# Patient Record
Sex: Female | Born: 1995 | Race: Black or African American | Hispanic: No | Marital: Single | Smoking: Never smoker
Health system: Southern US, Community
[De-identification: ages and names within clinical notes are randomized; demographics above are authoritative.]

## PROBLEM LIST (undated history)

## (undated) DIAGNOSIS — T7840XA Allergy, unspecified, initial encounter: Secondary | ICD-10-CM

## (undated) DIAGNOSIS — D649 Anemia, unspecified: Secondary | ICD-10-CM

## (undated) DIAGNOSIS — J45909 Unspecified asthma, uncomplicated: Secondary | ICD-10-CM

## (undated) DIAGNOSIS — K589 Irritable bowel syndrome without diarrhea: Secondary | ICD-10-CM

## (undated) DIAGNOSIS — M1990 Unspecified osteoarthritis, unspecified site: Secondary | ICD-10-CM

## (undated) HISTORY — DX: Unspecified asthma, uncomplicated: J45.909

## (undated) HISTORY — PX: TONSILLECTOMY: SUR1361

## (undated) HISTORY — DX: Anemia, unspecified: D64.9

## (undated) HISTORY — PX: WISDOM TOOTH EXTRACTION: SHX21

## (undated) HISTORY — DX: Allergy, unspecified, initial encounter: T78.40XA

---

## 2013-04-02 ENCOUNTER — Encounter: Payer: Self-pay | Admitting: Obstetrics

## 2013-04-02 ENCOUNTER — Ambulatory Visit (INDEPENDENT_AMBULATORY_CARE_PROVIDER_SITE_OTHER): Payer: Medicaid Other | Admitting: Obstetrics

## 2013-04-02 VITALS — BP 144/75 | HR 77 | Temp 99.0°F | Ht 59.0 in | Wt 145.0 lb

## 2013-04-02 DIAGNOSIS — Z3041 Encounter for surveillance of contraceptive pills: Secondary | ICD-10-CM

## 2013-04-02 DIAGNOSIS — Z3202 Encounter for pregnancy test, result negative: Secondary | ICD-10-CM

## 2013-04-02 DIAGNOSIS — Z0000 Encounter for general adult medical examination without abnormal findings: Secondary | ICD-10-CM

## 2013-04-02 DIAGNOSIS — Z113 Encounter for screening for infections with a predominantly sexual mode of transmission: Secondary | ICD-10-CM

## 2013-04-02 DIAGNOSIS — Z0289 Encounter for other administrative examinations: Secondary | ICD-10-CM

## 2013-04-02 LAB — POCT URINALYSIS DIPSTICK
Bilirubin, UA: NEGATIVE
Clarity, UA: NEGATIVE
Glucose, UA: NEGATIVE
Spec Grav, UA: 1.02
Urobilinogen, UA: NEGATIVE

## 2013-04-02 LAB — POCT URINE PREGNANCY: Preg Test, Ur: NEGATIVE

## 2013-04-02 NOTE — Progress Notes (Signed)
Subjective:     Judy Paul is a 17 y.o. female here for a routine exam.  Current complaints: wants to know if she has fibroids, would like to change type of birth control she is taking.  Personal health questionnaire reviewed: yes.   Gynecologic History Patient's last menstrual period was 03/28/2013. Contraception: OCP (estrogen/progesterone) Last Pap: N/A Last mammogram: N/A  Obstetric History OB History  No data available     The following portions of the patient's history were reviewed and updated as appropriate: allergies, current medications, past family history, past medical history, past social history, past surgical history and problem list.  Review of Systems Pertinent items are noted in HPI.    Objective:    General appearance: alert and no distress Abdomen: normal findings: soft, non-tender Pelvic: cervix normal in appearance, external genitalia normal, no adnexal masses or tenderness, no cervical motion tenderness, rectovaginal septum normal, uterus normal size, shape, and consistency and vagina normal without discharge    Assessment:    Healthy female exam.    Plan:    Education reviewed: safe sex/STD prevention.    F/U 1 year.

## 2013-04-03 LAB — WET PREP BY MOLECULAR PROBE: Candida species: NEGATIVE

## 2013-04-08 ENCOUNTER — Other Ambulatory Visit: Payer: Self-pay | Admitting: *Deleted

## 2013-04-08 DIAGNOSIS — B9689 Other specified bacterial agents as the cause of diseases classified elsewhere: Secondary | ICD-10-CM

## 2013-04-08 MED ORDER — METRONIDAZOLE 500 MG PO TABS: 500.0000 mg | ORAL_TABLET | Freq: Two times a day (BID) | ORAL | Status: DC

## 2013-08-07 ENCOUNTER — Encounter: Payer: Self-pay | Admitting: Obstetrics

## 2014-04-02 ENCOUNTER — Encounter: Payer: Self-pay | Admitting: Obstetrics

## 2014-04-02 ENCOUNTER — Other Ambulatory Visit: Payer: Self-pay | Admitting: Obstetrics

## 2014-04-02 ENCOUNTER — Ambulatory Visit (INDEPENDENT_AMBULATORY_CARE_PROVIDER_SITE_OTHER): Payer: Medicaid Other | Admitting: Obstetrics

## 2014-04-02 VITALS — BP 120/75 | HR 84 | Temp 100.0°F | Ht 59.0 in | Wt 145.0 lb

## 2014-04-02 DIAGNOSIS — Z0289 Encounter for other administrative examinations: Secondary | ICD-10-CM

## 2014-04-02 DIAGNOSIS — Z0000 Encounter for general adult medical examination without abnormal findings: Secondary | ICD-10-CM

## 2014-04-02 DIAGNOSIS — Z3041 Encounter for surveillance of contraceptive pills: Secondary | ICD-10-CM

## 2014-04-02 MED ORDER — NORGESTIMATE-ETH ESTRADIOL 0.25-35 MG-MCG PO TABS: 1.0000 | ORAL_TABLET | Freq: Every day | ORAL | Status: AC

## 2014-04-02 NOTE — Progress Notes (Signed)
Subjective:     Judy Paul is a 18 y.o. female here for a routine exam.  Current complaints: none.    Personal health questionnaire:  Is patient Judy Paul, have a family history of breast and/or ovarian cancer: no Is there a family history of uterine cancer diagnosed at age < 3750, gastrointestinal cancer, urinary tract cancer, family member who is a Personnel officerLynch syndrome-associated carrier: no Is the patient overweight and hypertensive, family history of diabetes, personal history of gestational diabetes or PCOS: no Is patient over 255, have PCOS,  family history of premature CHD under age 20, diabetes, smoke, have hypertension or peripheral artery disease:  no At any time, has a partner hit, kicked or otherwise hurt or frightened you?: no Over the past 2 weeks, have you felt down, depressed or hopeless?: no Over the past 2 weeks, have you felt little interest or pleasure in doing things?:no   Gynecologic History Patient's last menstrual period was 03/28/2014. Contraception: OCP (estrogen/progesterone) Last Pap: none. Results were: n/a Last mammogram: n/a. Results were: n/a  Obstetric History OB History  No data available    Past Medical History  Diagnosis Date  . Allergy   . Anemia   . Asthma     Past Surgical History  Procedure Laterality Date  . Tonsillectomy Bilateral   . Wisdom tooth extraction Bilateral     Current outpatient prescriptions: norgestimate-ethinyl estradiol (ORTHO-CYCLEN,SPRINTEC,PREVIFEM) 0.25-35 MG-MCG tablet, Take 1 tablet by mouth daily., Disp: 1 Package, Rfl: 11 Allergies  Allergen Reactions  . Latex Rash  . Mucinex [Guaifenesin Er] Swelling and Rash    History  Substance Use Topics  . Smoking status: Never Smoker   . Smokeless tobacco: Never Used  . Alcohol Use: No    Family History  Problem Relation Age of Onset  . Fibroids Mother   . Eczema Father   . Infertility Maternal Aunt   . Infertility Maternal Uncle   . Cancer Maternal  Grandmother   . Hypertension Maternal Grandmother   . Bipolar disorder Maternal Grandmother   . Cancer Paternal Grandmother   . Hypertension Paternal Grandmother       Review of Systems  Constitutional: negative for fatigue and weight loss Respiratory: negative for cough and wheezing Cardiovascular: negative for chest pain, fatigue and palpitations Gastrointestinal: negative for abdominal pain and change in bowel habits Musculoskeletal:negative for myalgias Neurological: negative for gait problems and tremors Behavioral/Psych: negative for abusive relationship, depression Endocrine: negative for temperature intolerance   Genitourinary:negative for abnormal menstrual periods, genital lesions, hot flashes, sexual problems and vaginal discharge Integument/breast: negative for breast lump, breast tenderness, nipple discharge and skin lesion(s)    Objective:       BP 120/75 mmHg  Pulse 84  Temp(Src) 100 F (37.8 C)  Ht 4\' 11"  (1.499 m)  Wt 145 lb (65.772 kg)  BMI 29.27 kg/m2  LMP 03/28/2014 General:   alert  Skin:   no rash or abnormalities  Lungs:   clear to auscultation bilaterally  Heart:   regular rate and rhythm, S1, S2 normal, no murmur, click, rub or gallop  Breasts:   normal without suspicious masses, skin or nipple changes or axillary nodes  Abdomen:  normal findings: no organomegaly, soft, non-tender and no hernia  Pelvis:  External genitalia: normal general appearance Urinary system: urethral meatus normal and bladder without fullness, nontender Vaginal: normal without tenderness, induration or masses Cervix: normal appearance Adnexa: normal bimanual exam Uterus: anteverted and non-tender, normal size   Lab Review Urine pregnancy  test Labs reviewed yes Radiologic studies reviewed no    Assessment:    Healthy female exam.    Contraceptive Surveillance.  Pleased with OCP's.   Plan:    Education reviewed: low fat, low cholesterol diet, safe sex/STD  prevention and weight bearing exercise. Contraception: OCP (estrogen/progesterone). Follow up in: 1 year.   Meds ordered this encounter  Medications  . norgestimate-ethinyl estradiol (ORTHO-CYCLEN,SPRINTEC,PREVIFEM) 0.25-35 MG-MCG tablet    Sig: Take 1 tablet by mouth daily.    Dispense:  1 Package    Refill:  11   No orders of the defined types were placed in this encounter.

## 2014-04-03 ENCOUNTER — Other Ambulatory Visit: Payer: Self-pay | Admitting: Obstetrics

## 2014-04-03 DIAGNOSIS — N760 Acute vaginitis: Principal | ICD-10-CM

## 2014-04-03 DIAGNOSIS — B9689 Other specified bacterial agents as the cause of diseases classified elsewhere: Secondary | ICD-10-CM

## 2014-04-03 LAB — WET PREP BY MOLECULAR PROBE
Candida species: NEGATIVE
GARDNERELLA VAGINALIS: POSITIVE — AB
Trichomonas vaginosis: NEGATIVE

## 2014-04-03 LAB — GC/CHLAMYDIA PROBE AMP
CT PROBE, AMP APTIMA: NEGATIVE
GC Probe RNA: NEGATIVE

## 2014-04-03 MED ORDER — TINIDAZOLE 500 MG PO TABS: 1000.0000 mg | ORAL_TABLET | Freq: Every day | ORAL | Status: DC

## 2014-06-25 ENCOUNTER — Ambulatory Visit: Payer: Medicaid Other | Admitting: Obstetrics

## 2014-07-28 ENCOUNTER — Emergency Department (HOSPITAL_COMMUNITY)
Admission: EM | Admit: 2014-07-28 | Discharge: 2014-07-29 | Disposition: A | Discharge status: Home / Self Care | Payer: Self-pay | Attending: Emergency Medicine | Admitting: Emergency Medicine

## 2014-07-28 ENCOUNTER — Encounter (HOSPITAL_COMMUNITY): Payer: Self-pay | Admitting: Emergency Medicine

## 2014-07-28 DIAGNOSIS — Z91040 Latex allergy status: Secondary | ICD-10-CM | POA: Insufficient documentation

## 2014-07-28 DIAGNOSIS — J45909 Unspecified asthma, uncomplicated: Secondary | ICD-10-CM | POA: Insufficient documentation

## 2014-07-28 DIAGNOSIS — R109 Unspecified abdominal pain: Secondary | ICD-10-CM

## 2014-07-28 DIAGNOSIS — Z3202 Encounter for pregnancy test, result negative: Secondary | ICD-10-CM | POA: Insufficient documentation

## 2014-07-28 DIAGNOSIS — K529 Noninfective gastroenteritis and colitis, unspecified: Secondary | ICD-10-CM

## 2014-07-28 DIAGNOSIS — R110 Nausea: Secondary | ICD-10-CM

## 2014-07-28 DIAGNOSIS — R197 Diarrhea, unspecified: Secondary | ICD-10-CM

## 2014-07-28 DIAGNOSIS — B3731 Acute candidiasis of vulva and vagina: Secondary | ICD-10-CM

## 2014-07-28 DIAGNOSIS — Z862 Personal history of diseases of the blood and blood-forming organs and certain disorders involving the immune mechanism: Secondary | ICD-10-CM | POA: Insufficient documentation

## 2014-07-28 DIAGNOSIS — B9689 Other specified bacterial agents as the cause of diseases classified elsewhere: Secondary | ICD-10-CM

## 2014-07-28 DIAGNOSIS — B373 Candidiasis of vulva and vagina: Secondary | ICD-10-CM | POA: Insufficient documentation

## 2014-07-28 DIAGNOSIS — N760 Acute vaginitis: Secondary | ICD-10-CM | POA: Insufficient documentation

## 2014-07-28 DIAGNOSIS — Z793 Long term (current) use of hormonal contraceptives: Secondary | ICD-10-CM | POA: Insufficient documentation

## 2014-07-28 DIAGNOSIS — R1013 Epigastric pain: Secondary | ICD-10-CM

## 2014-07-28 LAB — CBC WITH DIFFERENTIAL/PLATELET
BASOS ABS: 0.1 10*3/uL (ref 0.0–0.1)
Basophils Relative: 1 % (ref 0–1)
EOS PCT: 1 % (ref 0–5)
Eosinophils Absolute: 0 10*3/uL (ref 0.0–0.7)
HCT: 34.4 % — ABNORMAL LOW (ref 36.0–46.0)
Hemoglobin: 11.3 g/dL — ABNORMAL LOW (ref 12.0–15.0)
Lymphocytes Relative: 47 % — ABNORMAL HIGH (ref 12–46)
Lymphs Abs: 2.1 10*3/uL (ref 0.7–4.0)
MCH: 25.9 pg — AB (ref 26.0–34.0)
MCHC: 32.8 g/dL (ref 30.0–36.0)
MCV: 78.7 fL (ref 78.0–100.0)
MONO ABS: 0.3 10*3/uL (ref 0.1–1.0)
Monocytes Relative: 8 % (ref 3–12)
Neutro Abs: 1.9 10*3/uL (ref 1.7–7.7)
Neutrophils Relative %: 43 % (ref 43–77)
PLATELETS: 245 10*3/uL (ref 150–400)
RBC: 4.37 MIL/uL (ref 3.87–5.11)
RDW: 12.7 % (ref 11.5–15.5)
WBC: 4.4 10*3/uL (ref 4.0–10.5)

## 2014-07-28 LAB — URINALYSIS, ROUTINE W REFLEX MICROSCOPIC
BILIRUBIN URINE: NEGATIVE
Glucose, UA: NEGATIVE mg/dL
HGB URINE DIPSTICK: NEGATIVE
Ketones, ur: NEGATIVE mg/dL
Nitrite: NEGATIVE
PROTEIN: NEGATIVE mg/dL
Specific Gravity, Urine: 1.016 (ref 1.005–1.030)
Urobilinogen, UA: 1 mg/dL (ref 0.0–1.0)
pH: 8 (ref 5.0–8.0)

## 2014-07-28 LAB — COMPREHENSIVE METABOLIC PANEL
ALK PHOS: 22 U/L — AB (ref 39–117)
ALT: 19 U/L (ref 0–35)
ANION GAP: 8 (ref 5–15)
AST: 24 U/L (ref 0–37)
Albumin: 3.5 g/dL (ref 3.5–5.2)
BILIRUBIN TOTAL: 0.3 mg/dL (ref 0.3–1.2)
BUN: 6 mg/dL (ref 6–23)
CHLORIDE: 108 mmol/L (ref 96–112)
CO2: 22 mmol/L (ref 19–32)
CREATININE: 0.74 mg/dL (ref 0.50–1.10)
Calcium: 9 mg/dL (ref 8.4–10.5)
GFR calc Af Amer: >90 mL/min (ref >90)
GFR calc non Af Amer: >90 mL/min (ref >90)
Glucose, Bld: 96 mg/dL (ref 70–99)
Potassium: 3.9 mmol/L (ref 3.5–5.1)
Sodium: 138 mmol/L (ref 135–145)
Total Protein: 6.1 g/dL (ref 6.0–8.3)

## 2014-07-28 LAB — URINE MICROSCOPIC-ADD ON

## 2014-07-28 LAB — LIPASE, BLOOD: Lipase: 37 U/L (ref 11–59)

## 2014-07-28 LAB — PREGNANCY, URINE: PREG TEST UR: NEGATIVE

## 2014-07-28 MED ORDER — DICYCLOMINE HCL 10 MG/ML IM SOLN
20.0000 mg | Freq: Once | INTRAMUSCULAR | Status: AC
  Administered 2014-07-28: 20 mg via INTRAMUSCULAR
  Filled 2014-07-28: qty 2

## 2014-07-28 MED ORDER — SODIUM CHLORIDE 0.9 % IV SOLN
INTRAVENOUS | Status: DC
  Administered 2014-07-28: 23:00:00 via INTRAVENOUS

## 2014-07-28 MED ORDER — IOHEXOL 300 MG/ML  SOLN
25.0000 mL | Freq: Once | INTRAMUSCULAR | Status: AC | PRN
  Administered 2014-07-28: 25 mL via ORAL

## 2014-07-28 MED ORDER — ONDANSETRON HCL 4 MG/2ML IJ SOLN: 4.0000 mg | INTRAMUSCULAR | Status: DC | PRN

## 2014-07-28 MED ORDER — ONDANSETRON 4 MG PO TBDP
8.0000 mg | ORAL_TABLET | Freq: Once | ORAL | Status: AC
  Administered 2014-07-28: 8 mg via ORAL
  Filled 2014-07-28: qty 2

## 2014-07-28 NOTE — ED Notes (Signed)
Pt states also has had yellowish vaginal discharge

## 2014-07-28 NOTE — ED Notes (Signed)
Pt from home, states has had nausea started last week, cramping started 2 days ago, diarrhea started 2 days ago-- [place] in [place], returned Friday. Started feeling sick while in [place]. States lost 10 pounds in past few days. Unable to eat anything.

## 2014-07-29 ENCOUNTER — Encounter (HOSPITAL_COMMUNITY): Payer: Self-pay

## 2014-07-29 ENCOUNTER — Emergency Department (HOSPITAL_COMMUNITY): Payer: Self-pay

## 2014-07-29 LAB — GC/CHLAMYDIA PROBE AMP (~~LOC~~) NOT AT ARMC
Chlamydia: NEGATIVE
Neisseria Gonorrhea: NEGATIVE

## 2014-07-29 LAB — WET PREP, GENITAL: TRICH WET PREP: NONE SEEN

## 2014-07-29 MED ORDER — CIPROFLOXACIN HCL 500 MG PO TABS: 500.0000 mg | ORAL_TABLET | Freq: Two times a day (BID) | ORAL | Status: DC

## 2014-07-29 MED ORDER — METRONIDAZOLE 500 MG PO TABS: 500.0000 mg | ORAL_TABLET | Freq: Three times a day (TID) | ORAL | Status: DC

## 2014-07-29 MED ORDER — PROMETHAZINE HCL 25 MG PO TABS: 25.0000 mg | ORAL_TABLET | Freq: Four times a day (QID) | ORAL | Status: DC | PRN

## 2014-07-29 MED ORDER — CIPROFLOXACIN HCL 500 MG PO TABS
500.0000 mg | ORAL_TABLET | Freq: Once | ORAL | Status: AC
  Administered 2014-07-29: 500 mg via ORAL
  Filled 2014-07-29: qty 1

## 2014-07-29 MED ORDER — METRONIDAZOLE 500 MG PO TABS
500.0000 mg | ORAL_TABLET | Freq: Once | ORAL | Status: AC
  Administered 2014-07-29: 500 mg via ORAL
  Filled 2014-07-29: qty 1

## 2014-07-29 MED ORDER — FLUCONAZOLE 100 MG PO TABS
150.0000 mg | ORAL_TABLET | Freq: Once | ORAL | Status: AC
  Administered 2014-07-29: 150 mg via ORAL
  Filled 2014-07-29: qty 2

## 2014-07-29 MED ORDER — IOHEXOL 300 MG/ML  SOLN
100.0000 mL | Freq: Once | INTRAMUSCULAR | Status: AC | PRN
  Administered 2014-07-29: 100 mL via INTRAVENOUS

## 2014-07-29 NOTE — ED Provider Notes (Signed)
CSN: [id number]     Arrival date & time 07/28/14  7846 History   First MD Initiated Contact with Patient 07/28/14 2220     Chief Complaint  Patient presents with  . Nausea  . Abdominal Cramping  . Diarrhea    HPI  Pt was seen at 2230. Per pt, c/o gradual onset and persistence of multiple intermittent episodes of diarrhea that began 2 days ago.   Describes the stools as initially "loose" then "watery." Has been associated with nausea and generalized "cramping" abd pain. Denies vomiting, no CP/SOB, no back pain, no fevers, no black or blood in stools. Pt states she was "starting to have symptoms" while in [place] (nausea), returned to the [place] on Friday (4 days ago). Pt states she was living with a local family there; water was "chlorined" and no family members were ill or had similar symptoms. Pt also c/o "yellow" vaginal discharge" for the past week. Denies pelvic pain, no dysuria, no vaginal bleeding.    Past Medical History  Diagnosis Date  . Allergy   . Anemia   . Asthma    Past Surgical History  Procedure Laterality Date  . Tonsillectomy Bilateral   . Wisdom tooth extraction Bilateral    Family History  Problem Relation Age of Onset  . Fibroids Mother   . Eczema Father   . Infertility Maternal Aunt   . Infertility Maternal Uncle   . Cancer Maternal Grandmother   . Hypertension Maternal Grandmother   . Bipolar disorder Maternal Grandmother   . Cancer Paternal Grandmother   . Hypertension Paternal Grandmother    History  Substance Use Topics  . Smoking status: Never Smoker   . Smokeless tobacco: Never Used  . Alcohol Use: No    Review of Systems ROS: Statement: All systems negative except as marked or noted in the HPI; Constitutional: Negative for fever and chills. ; ; Eyes: Negative for eye pain, redness and discharge. ; ; ENMT: Negative for ear pain, hoarseness, nasal congestion, sinus pressure and sore throat. ; ; Cardiovascular: Negative for chest pain, palpitations,  diaphoresis, dyspnea and peripheral edema. ; ; Respiratory: Negative for cough, wheezing and stridor. ; ; Gastrointestinal: +nausea, abd pain. Negative for vomiting, blood in stool, hematemesis, jaundice and rectal bleeding. . ; ; Genitourinary: Negative for dysuria, flank pain and hematuria. ; ; GYN:  No vaginal bleeding, +vaginal discharge, no vulvar pain. ;; Musculoskeletal: Negative for back pain and neck pain. Negative for swelling and trauma.; ; Skin: Negative for pruritus, rash, abrasions, blisters, bruising and skin lesion.; ; Neuro: Negative for headache, lightheadedness and neck stiffness. Negative for weakness, altered level of consciousness , altered mental status, extremity weakness, paresthesias, involuntary movement, seizure and syncope.      Allergies  Latex and Mucinex  Home Medications   Prior to Admission medications   Medication Sig Start Date End Date Taking? Authorizing Provider  norgestimate-ethinyl estradiol (ORTHO-CYCLEN,SPRINTEC,PREVIFEM) 0.25-35 MG-MCG tablet Take 1 tablet by mouth daily. 04/02/14  Yes Brock Bad, MD  tinidazole (TINDAMAX) 500 MG tablet Take 2 tablets (1,000 mg total) by mouth daily with breakfast. Patient not taking: Reported on 07/28/2014 04/03/14   Brock Bad, MD   BP 113/74 mmHg  Pulse 57  Temp(Src) 98.9 F (37.2 C) (Oral)  Resp 18  Ht  (1.6 m)  Wt 145 lb 7 oz (65.97 kg)  BMI 25.77 kg/m2  SpO2 100%  LMP 05/22/2014 Physical Exam  2235: Physical examination:  Nursing notes reviewed; Vital signs  and O2 SAT reviewed;  Constitutional: Well developed, Well nourished, Well hydrated, In no acute distress; Head:  Normocephalic, atraumatic; Eyes: EOMI, PERRL, No scleral icterus; ENMT: Mouth and pharynx normal, Mucous membranes moist; Neck: Supple, Full range of motion, No lymphadenopathy; Cardiovascular: Regular rate and rhythm, No murmur, rub, or gallop; Respiratory: Breath sounds clear & equal bilaterally, No rales, rhonchi, wheezes.   Speaking full sentences with ease, Normal respiratory effort/excursion; Chest: Nontender, Movement normal; Abdomen: Soft, +mild diffuse tenderness to palp. No rebound or guarding. Nondistended, Normal bowel sounds; Genitourinary: No CVA tenderness. Pelvic exam performed with permission of pt and female ED tech assist during exam.  External genitalia w/o lesions. Vaginal vault with thick white discharge.  Cervix w/o lesions, not friable, GC/chlam and wet prep obtained and sent to lab.  Bimanual exam w/o CMT, uterine or adnexal tenderness.; Extremities: Pulses normal, No tenderness, No edema, No calf edema or asymmetry.; Neuro: AA&Ox3, Major CN grossly intact.  Speech clear. No gross focal motor or sensory deficits in extremities. Climbs on and off stretcher easily by herself. Gait steady.; Skin: Color normal, Warm, Dry.   ED Course  Procedures     EKG Interpretation None      MDM  MDM Reviewed: previous chart, nursing note and vitals Reviewed previous: labs Interpretation: labs and CT scan     Results for orders placed or performed during the hospital encounter of 07/28/14  Wet prep, genital  Result Value Ref Range   Yeast Wet Prep HPF POC FEW (A) NONE SEEN   Trich, Wet Prep NONE SEEN NONE SEEN   Clue Cells Wet Prep HPF POC FEW (A) NONE SEEN   WBC, Wet Prep HPF POC MANY (A) NONE SEEN  CBC with Differential  Result Value Ref Range   WBC 4.4 4.0 - 10.5 K/uL   RBC 4.37 3.87 - 5.11 MIL/uL   Hemoglobin 11.3 (L) 12.0 - 15.0 g/dL   HCT 14.734.4 (L) 82.936.0 - 56.246.0 %   MCV 78.7 78.0 - 100.0 fL   MCH 25.9 (L) 26.0 - 34.0 pg   MCHC 32.8 30.0 - 36.0 g/dL   RDW 13.012.7 86.511.5 - 78.415.5 %   Platelets 245 150 - 400 K/uL   Neutrophils Relative % 43 43 - 77 %   Neutro Abs 1.9 1.7 - 7.7 K/uL   Lymphocytes Relative 47 (H) 12 - 46 %   Lymphs Abs 2.1 0.7 - 4.0 K/uL   Monocytes Relative 8 3 - 12 %   Monocytes Absolute 0.3 0.1 - 1.0 K/uL   Eosinophils Relative 1 0 - 5 %   Eosinophils Absolute 0.0 0.0 - 0.7  K/uL   Basophils Relative 1 0 - 1 %   Basophils Absolute 0.1 0.0 - 0.1 K/uL  Comprehensive metabolic panel  Result Value Ref Range   Sodium 138 135 - 145 mmol/L   Potassium 3.9 3.5 - 5.1 mmol/L   Chloride 108 96 - 112 mmol/L   CO2 22 19 - 32 mmol/L   Glucose, Bld 96 70 - 99 mg/dL   BUN 6 6 - 23 mg/dL   Creatinine, Ser 6.960.74 0.50 - 1.10 mg/dL   Calcium 9.0 8.4 - 29.510.5 mg/dL   Total Protein 6.1 6.0 - 8.3 g/dL   Albumin 3.5 3.5 - 5.2 g/dL   AST 24 0 - 37 U/L   ALT 19 0 - 35 U/L   Alkaline Phosphatase 22 (L) 39 - 117 U/L   Total Bilirubin 0.3 0.3 - 1.2 mg/dL  GFR calc non Af Amer >90 >90 mL/min   GFR calc Af Amer >90 >90 mL/min   Anion gap 8 5 - 15  Lipase, blood  Result Value Ref Range   Lipase 37 11 - 59 U/L  Urinalysis, Routine w reflex microscopic  Result Value Ref Range   Color, Urine YELLOW YELLOW   APPearance CLEAR CLEAR   Specific Gravity, Urine 1.016 1.005 - 1.030   pH 8.0 5.0 - 8.0   Glucose, UA NEGATIVE NEGATIVE mg/dL   Hgb urine dipstick NEGATIVE NEGATIVE   Bilirubin Urine NEGATIVE NEGATIVE   Ketones, ur NEGATIVE NEGATIVE mg/dL   Protein, ur NEGATIVE NEGATIVE mg/dL   Urobilinogen, UA 1.0 0.0 - 1.0 mg/dL   Nitrite NEGATIVE NEGATIVE   Leukocytes, UA SMALL (A) NEGATIVE  Urine microscopic-add on  Result Value Ref Range   Squamous Epithelial / LPF RARE RARE   WBC, UA 0-2 <3 WBC/hpf   Bacteria, UA RARE RARE  Pregnancy, urine  Result Value Ref Range   Preg Test, Ur NEGATIVE NEGATIVE    2300:   Labs and UA/preg resulted. Pt unable to stool while in the ED.  No N/V while in the ED. Abd cramping "better" after meds. CT A/P pending. T/C to ID Dr. Ilsa Iha, case discussed, including:  HPI, pertinent PM/SHx, VS/PE, dx testing, ED course and treatment:  Agrees with ED workup, obtain stool GI pathogen panel, rx cipro  PO BID #6 tabs.  Dx and testing, as well as d/w ID MD, d/w pt.  Questions answered.  Verb understanding, agreeable with plan.   0120:  Pt has not stooled  while in the ED. Pt has tol PO well without N/V. CT A/P pending. Pt will need to be tx for BV and candidal vaginitis on d/c. Sign out to Dr. Norlene Campbell.   Samuel Jester, DO 07/29/14 0126

## 2014-07-29 NOTE — ED Notes (Signed)
Patient transported to CT 

## 2014-07-29 NOTE — Discharge Instructions (Signed)
Your CAT scan today showed signs of colitis, which is infection or inflammation of your colon.  Take antibiotics as prescribed.  Return to the emergency department for worsening pain, vomiting despite medication, persistent fevers or new concerning symptoms.  Follow-up with your primary care doctor or with the health and wellness Center listed above for recheck in 3-5 days.  Make sure that you complete all of your antibiotics.    Abdominal Pain, Women Abdominal (stomach, pelvic, or belly) pain can be caused by many things. It is important to tell your doctor:  The location of the pain.  Does it come and go or is it present all the time?  Are there things that start the pain (eating certain foods, exercise)?  Are there other symptoms associated with the pain (fever, nausea, vomiting, diarrhea)? All of this is helpful to know when trying to find the cause of the pain. CAUSES   Stomach: virus or bacteria infection, or ulcer.  Intestine: appendicitis (inflamed appendix), regional ileitis (Crohn's disease), ulcerative colitis (inflamed colon), irritable bowel syndrome, diverticulitis (inflamed diverticulum of the colon), or cancer of the stomach or intestine.  Gallbladder disease or stones in the gallbladder.  Kidney disease, kidney stones, or infection.  Pancreas infection or cancer.  Fibromyalgia (pain disorder).  Diseases of the female organs:  Uterus: fibroid (non-cancerous) tumors or infection.  Fallopian tubes: infection or tubal pregnancy.  Ovary: cysts or tumors.  Pelvic adhesions (scar tissue).  Endometriosis (uterus lining tissue growing in the pelvis and on the pelvic organs).  Pelvic congestion syndrome (female organs filling up with blood just before the menstrual period).  Pain with the menstrual period.  Pain with ovulation (producing an egg).  Pain with an IUD (intrauterine device, birth control) in the uterus.  Cancer of the female organs.  Functional  pain (pain not caused by a disease, may improve without treatment).  Psychological pain.  Depression. DIAGNOSIS  Your doctor will decide the seriousness of your pain by doing an examination.  Blood tests.  X-rays.  Ultrasound.  CT scan (computed tomography, special type of X-ray).  MRI (magnetic resonance imaging).  Cultures, for infection.  Barium enema (dye inserted in the large intestine, to better view it with X-rays).  Colonoscopy (looking in intestine with a lighted tube).  Laparoscopy (minor surgery, looking in abdomen with a lighted tube).  Major abdominal exploratory surgery (looking in abdomen with a large incision). TREATMENT  The treatment will depend on the cause of the pain.   Many cases can be observed and treated at home.  Over-the-counter medicines recommended by your caregiver.  Prescription medicine.  Antibiotics, for infection.  Birth control pills, for painful periods or for ovulation pain.  Hormone treatment, for endometriosis.  Nerve blocking injections.  Physical therapy.  Antidepressants.  Counseling with a psychologist or psychiatrist.  Minor or major surgery. HOME CARE INSTRUCTIONS   Do not take laxatives, unless directed by your caregiver.  Take over-the-counter pain medicine only if ordered by your caregiver. Do not take aspirin because it can cause an upset stomach or bleeding.  Try a clear liquid diet (broth or water) as ordered by your caregiver. Slowly move to a bland diet, as tolerated, if the pain is related to the stomach or intestine.  Have a thermometer and take your temperature several times a day, and record it.  Bed rest and sleep, if it helps the pain.  Avoid sexual intercourse, if it causes pain.  Avoid stressful situations.  Keep your follow-up appointments  and tests, as your caregiver orders.  If the pain does not go away with medicine or surgery, you may try:  Acupuncture.  Relaxation exercises  (yoga, meditation).  Group therapy.  Counseling. SEEK MEDICAL CARE IF:   You notice certain foods cause stomach pain.  Your home care treatment is not helping your pain.  You need stronger pain medicine.  You want your IUD removed.  You feel faint or lightheaded.  You develop nausea and vomiting.  You develop a rash.  You are having side effects or an allergy to your medicine. SEEK IMMEDIATE MEDICAL CARE IF:   Your pain does not go away or gets worse.  You have a fever.  Your pain is felt only in portions of the abdomen. The right side could possibly be appendicitis. The left lower portion of the abdomen could be colitis or diverticulitis.  You are passing blood in your stools (bright red or black tarry stools, with or without vomiting).  You have blood in your urine.  You develop chills, with or without a fever.  You pass out. MAKE SURE YOU:   Understand these instructions.  Will watch your condition.  Will get help right away if you are not doing well or get worse. Document Released: 02/26/2007 Document Revised: 09/15/2013 Document Reviewed: 03/18/2009 [facility] Patient Information 2015 [place], [place]. This information is not intended to replace advice given to you by your health care provider. Make sure you discuss any questions you have with your health care provider.  Candidal Vulvovaginitis Candidal vulvovaginitis is an infection of the vagina and vulva. The vulva is the skin around the opening of the vagina. This may cause itching and discomfort in and around the vagina.  HOME CARE  Only take medicine as told by your doctor.  Do not have sex (intercourse) until the infection is healed or as told by your doctor.  Practice safe sex.  Tell your sex partner about your infection.  Do not douche or use tampons.  Wear cotton underwear. Do not wear tight pants or panty hose.  Eat yogurt. This may help treat and prevent yeast infections. GET HELP RIGHT  AWAY IF:   You have a fever.  Your problems get worse during treatment or do not get better in 3 days.  You have discomfort, irritation, or itching in your vagina or vulva area.  You have pain after sex.  You start to get belly (abdominal) pain. MAKE SURE YOU:  Understand these instructions.  Will watch your condition.  Will get help right away if you are not doing well or get worse. Document Released: 07/28/2008 Document Revised: 05/06/2013 Document Reviewed: 07/28/2008 [facility] Patient Information 2015 [place], [place]. This information is not intended to replace advice given to you by your health care provider. Make sure you discuss any questions you have with your health care provider.  Colitis Colitis is inflammation of the colon. Colitis can be a short-term or long-standing (chronic) illness. Crohn's disease and ulcerative colitis are 2 types of colitis which are chronic. They usually require lifelong treatment. CAUSES  There are many different causes of colitis, including:  Viruses.  Germs (bacteria).  Medicine reactions. SYMPTOMS   Diarrhea.  Intestinal bleeding.  Pain.  Fever.  Throwing up (vomiting).  Tiredness (fatigue).  Weight loss.  Bowel blockage. DIAGNOSIS  The diagnosis of colitis is based on examination and stool or blood tests. X-rays, CT scan, and colonoscopy may also be needed. TREATMENT  Treatment may include:  Fluids given through  the vein (intravenously).  Bowel rest (nothing to eat or drink for a period of time).  Medicine for pain and diarrhea.  Medicines (antibiotics) that kill germs.  Cortisone medicines.  Surgery. HOME CARE INSTRUCTIONS   Get plenty of rest.  Drink enough water and fluids to keep your urine clear or pale yellow.  Eat a well-balanced diet.  Call your caregiver for follow-up as recommended. SEEK IMMEDIATE MEDICAL CARE IF:   You develop chills.  You have an oral temperature above 102 F (38.9 C),  not controlled by medicine.  You have extreme weakness, fainting, or dehydration.  You have repeated vomiting.  You develop severe belly (abdominal) pain or are passing bloody or tarry stools. MAKE SURE YOU:   Understand these instructions.  Will watch your condition.  Will get help right away if you are not doing well or get worse. Document Released: 06/08/2004 Document Revised: 07/24/2011 Document Reviewed: 09/03/2009 [facility] Patient Information 2015 [place]. This information is not intended to replace advice given to you by your health care provider. Make sure you discuss any questions you have with your health care provider.    Vaginitis Vaginitis is an inflammation of the vagina. It can happen when the normal bacteria and yeast in the vagina grow too much. There are different types. Treatment will depend on the type you have. HOME CARE  Take all medicines as told by your doctor.  Keep your vagina area clean and dry. Avoid soap. Rinse the area with water.  Avoid washing and cleaning out the vagina (douching).  Do not use tampons or have sex (intercourse) until your treatment is done.  Wipe from front to back after going to the restroom.  Wear cotton underwear.  Avoid wearing underwear while you sleep until your vaginitis is gone.  Avoid tight pants. Avoid underwear or nylons without a cotton panel.  Take off wet clothing (such as a bathing suit) as soon as you can.  Use mild, unscented products. Avoid fabric softeners and scented:  Feminine sprays.  Laundry detergents.  Tampons.  Soaps or bubble baths.  Practice safe sex and use condoms. GET HELP RIGHT AWAY IF:   You have belly (abdominal) pain.  You have a fever or lasting symptoms for more than 2-3 days.  You have a fever and your symptoms suddenly get worse. MAKE SURE YOU:   Understand these instructions.  Will watch this condition.  Will get help right away if you are not doing well or  get worse. Document Released: 07/28/2008 Document Revised: 01/24/2012 Document Reviewed: 10/12/2011 [facility] Patient Information 2015 [place], [place]. This information is not intended to replace advice given to you by your health care provider. Make sure you discuss any questions you have with your health care provider.

## 2014-07-29 NOTE — ED Provider Notes (Signed)
Care assumed from Dr. Charleston Poot at change of shift.  Patient with 2 days of diarrhea, abdominal cramping pain.  Patient recently in [place].  Plan is to treat for bv, vaginal yeast, and cipro for travellers diarrhea barring any findings on CT scan.  CT with diffuse colitis, will treat with cipro/flagyl for 10 days  Results for orders placed or performed during the hospital encounter of 07/28/14  Wet prep, genital  Result Value Ref Range   Yeast Wet Prep HPF POC FEW (A) NONE SEEN   Trich, Wet Prep NONE SEEN NONE SEEN   Clue Cells Wet Prep HPF POC FEW (A) NONE SEEN   WBC, Wet Prep HPF POC MANY (A) NONE SEEN  CBC with Differential  Result Value Ref Range   WBC 4.4 4.0 - 10.5 K/uL   RBC 4.37 3.87 - 5.11 MIL/uL   Hemoglobin 11.3 (L) 12.0 - 15.0 g/dL   HCT 16.1 (L) 09.6 - 04.5 %   MCV 78.7 78.0 - 100.0 fL   MCH 25.9 (L) 26.0 - 34.0 pg   MCHC 32.8 30.0 - 36.0 g/dL   RDW 40.9 81.1 - 91.4 %   Platelets 245 150 - 400 K/uL   Neutrophils Relative % 43 43 - 77 %   Neutro Abs 1.9 1.7 - 7.7 K/uL   Lymphocytes Relative 47 (H) 12 - 46 %   Lymphs Abs 2.1 0.7 - 4.0 K/uL   Monocytes Relative 8 3 - 12 %   Monocytes Absolute 0.3 0.1 - 1.0 K/uL   Eosinophils Relative 1 0 - 5 %   Eosinophils Absolute 0.0 0.0 - 0.7 K/uL   Basophils Relative 1 0 - 1 %   Basophils Absolute 0.1 0.0 - 0.1 K/uL  Comprehensive metabolic panel  Result Value Ref Range   Sodium 138 135 - 145 mmol/L   Potassium 3.9 3.5 - 5.1 mmol/L   Chloride 108 96 - 112 mmol/L   CO2 22 19 - 32 mmol/L   Glucose, Bld 96 70 - 99 mg/dL   BUN 6 6 - 23 mg/dL   Creatinine, Ser 7.82 0.50 - 1.10 mg/dL   Calcium 9.0 8.4 - 95.6 mg/dL   Total Protein 6.1 6.0 - 8.3 g/dL   Albumin 3.5 3.5 - 5.2 g/dL   AST 24 0 - 37 U/L   ALT 19 0 - 35 U/L   Alkaline Phosphatase 22 (L) 39 - 117 U/L   Total Bilirubin 0.3 0.3 - 1.2 mg/dL   GFR calc non Af Amer >90 >90 mL/min   GFR calc Af Amer >90 >90 mL/min   Anion gap 8 5 - 15  Lipase, blood  Result Value Ref Range   Lipase 37 11 - 59 U/L  Urinalysis, Routine w reflex microscopic  Result Value Ref Range   Color, Urine YELLOW YELLOW   APPearance CLEAR CLEAR   Specific Gravity, Urine 1.016 1.005 - 1.030   pH 8.0 5.0 - 8.0   Glucose, UA NEGATIVE NEGATIVE mg/dL   Hgb urine dipstick NEGATIVE NEGATIVE   Bilirubin Urine NEGATIVE NEGATIVE   Ketones, ur NEGATIVE NEGATIVE mg/dL   Protein, ur NEGATIVE NEGATIVE mg/dL   Urobilinogen, UA 1.0 0.0 - 1.0 mg/dL   Nitrite NEGATIVE NEGATIVE   Leukocytes, UA SMALL (A) NEGATIVE  Urine microscopic-add on  Result Value Ref Range   Squamous Epithelial / LPF RARE RARE   WBC, UA 0-2 <3 WBC/hpf   Bacteria, UA RARE RARE  Pregnancy, urine  Result Value Ref Range  Preg Test, Ur NEGATIVE NEGATIVE   Ct Abdomen Pelvis W Contrast  07/29/2014   CLINICAL DATA:  Epigastric pain and cramping. Recent foreign travel to [place].  EXAM: CT ABDOMEN AND PELVIS WITH CONTRAST  TECHNIQUE: Multidetector CT imaging of the abdomen and pelvis was performed using the standard protocol following bolus administration of intravenous contrast.  CONTRAST:  100mL OMNIPAQUE IOHEXOL 300 MG/ML  SOLN  COMPARISON:  None.  FINDINGS: There is mild mural thickening and edema of the colon beginning in the proximal transverse colon and extending continuously to the rectum. There is relative sparing of the cecum and ascending colon. The appearances are typical of infectious or inflammatory colitis. There is no bowel obstruction. There is no extraluminal air. There is no abscess or drainable fluid collection. Stomach and small bowel appear normal. The mural thickening and edema do not conform to a vascular territory to suggest ischemia.  There are normal appearances of the liver, spleen, pancreas, adrenals and kidneys. No significant abnormality is evident in the lower chest. No significant musculoskeletal abnormality is evident.  IMPRESSION: Mild changes of colitis, likely infectious or inflammatory. No  obstruction, perforation or abscess.   Electronically Signed   By: Ellery Plunkaniel R Mitchell M.D.   On: 07/29/2014 01:56      Marisa Severinlga Deisi Salonga, MD 07/29/14 Earle [id number]

## 2014-08-03 ENCOUNTER — Other Ambulatory Visit (HOSPITAL_COMMUNITY)
Admission: RE | Admit: 2014-08-03 | Discharge: 2014-08-03 | Disposition: A | Discharge status: Home / Self Care | Payer: Self-pay | Source: Ambulatory Visit | Attending: Emergency Medicine | Admitting: Emergency Medicine

## 2014-08-03 DIAGNOSIS — R197 Diarrhea, unspecified: Secondary | ICD-10-CM | POA: Insufficient documentation

## 2014-08-03 LAB — OCCULT BLOOD X 1 CARD TO LAB, STOOL: FECAL OCCULT BLD: NEGATIVE

## 2014-08-03 LAB — CLOSTRIDIUM DIFFICILE BY PCR: Toxigenic C. Difficile by PCR: NEGATIVE

## 2014-08-04 LAB — OVA AND PARASITE EXAMINATION

## 2014-08-06 LAB — STOOL CULTURE

## 2014-08-11 ENCOUNTER — Telehealth (HOSPITAL_COMMUNITY): Payer: Self-pay

## 2015-11-21 IMAGING — CT CT ABD-PELV W/ CM
2 of 4 series · 16 of 46 positions shown, 18 images · IV contrast (Omni 300)
Comparison: None.

CLINICAL DATA: Epigastric pain and cramping. Recent foreign travel
to [place].

EXAM:
CT ABDOMEN AND PELVIS WITH CONTRAST
TECHNIQUE: Multidetector CT imaging of the abdomen and pelvis was performed
using the standard protocol following bolus administration of
intravenous contrast.
CONTRAST:  100mL OMNIPAQUE IOHEXOL 300 MG/ML  SOLN

[Series 2: abd/ pelvis 5.0 i30f 1 · axial · 0.62mm/px · z∈[-433,+2]mm · 13 of 95 slices shown, 15 images]
[im 4/95  soft-tissue]
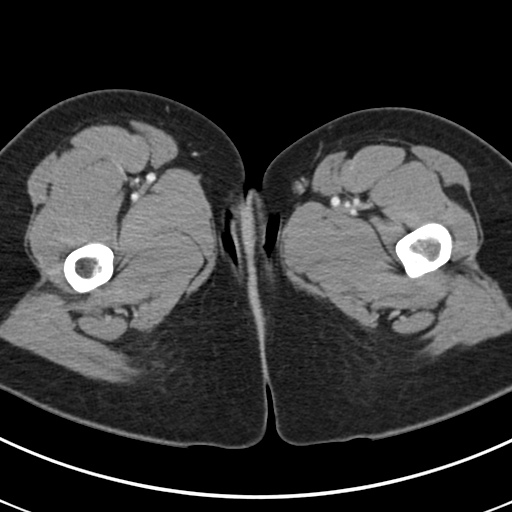
[im 4/95  bone]
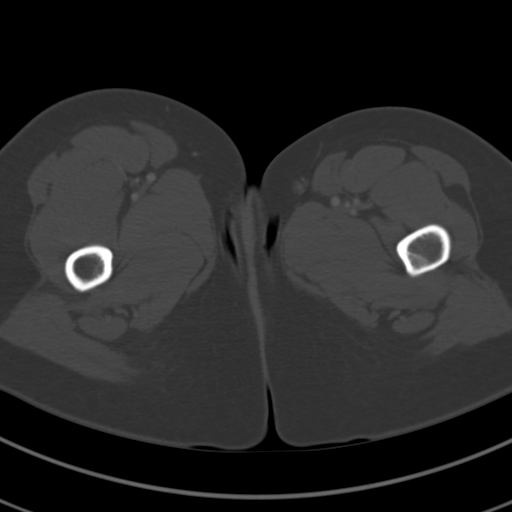
[im 12/95  soft-tissue]
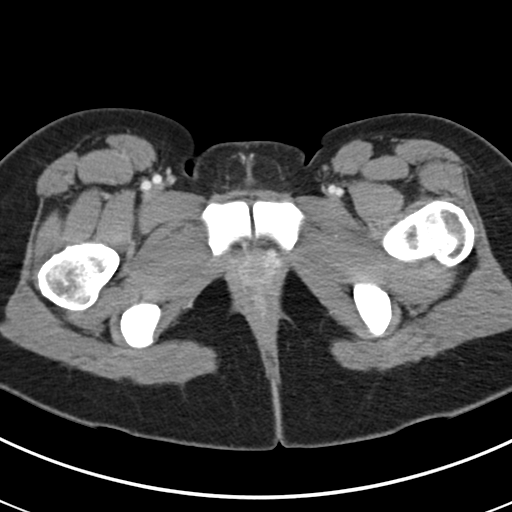
[im 19/95  soft-tissue]
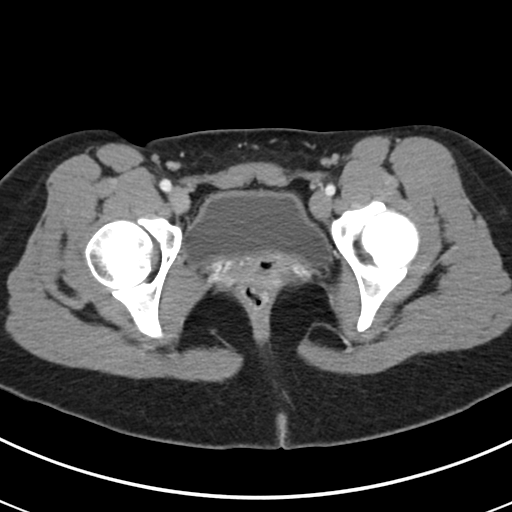
[im 27/95  soft-tissue]
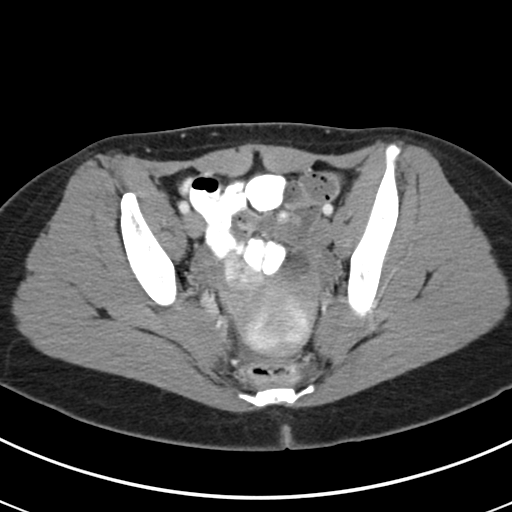
[im 34/95  soft-tissue]
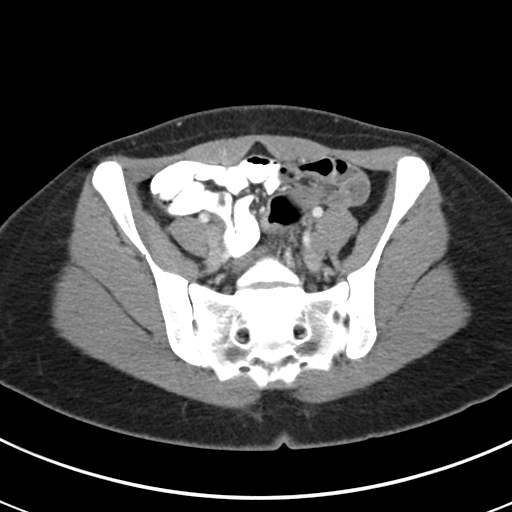
[im 42/95  soft-tissue]
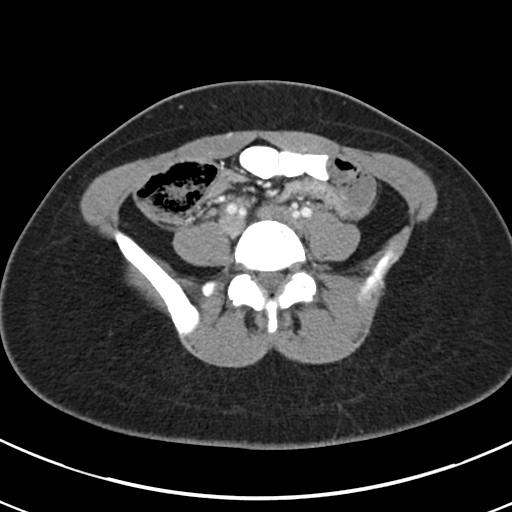
[im 49/95  soft-tissue]
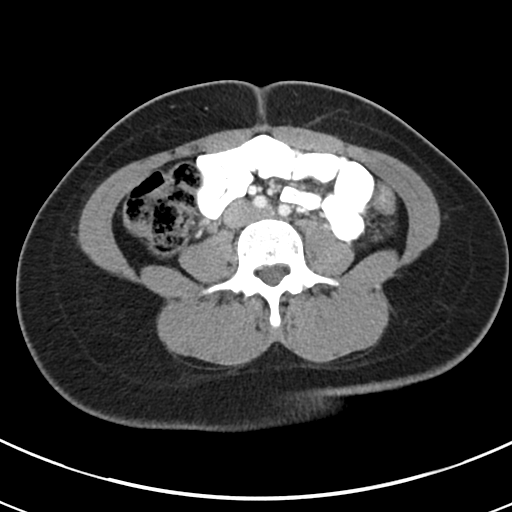
[im 53/95  soft-tissue]
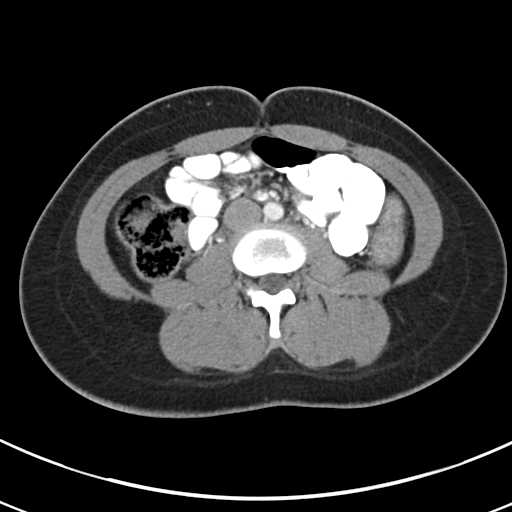
[im 61/95  soft-tissue]
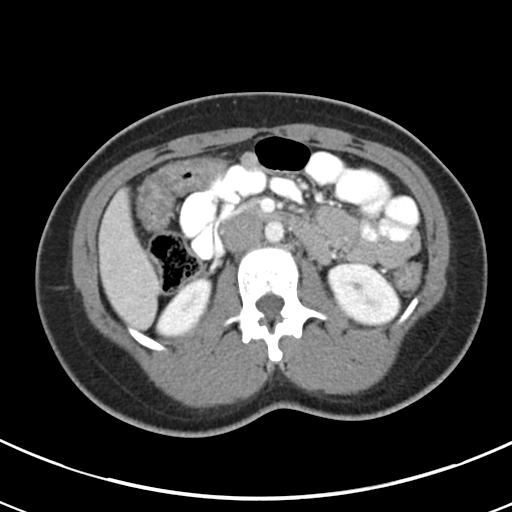
[im 61/95  bone]
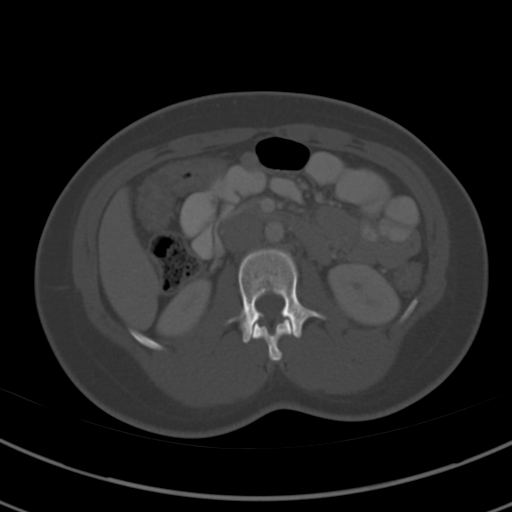
[im 68/95  soft-tissue]
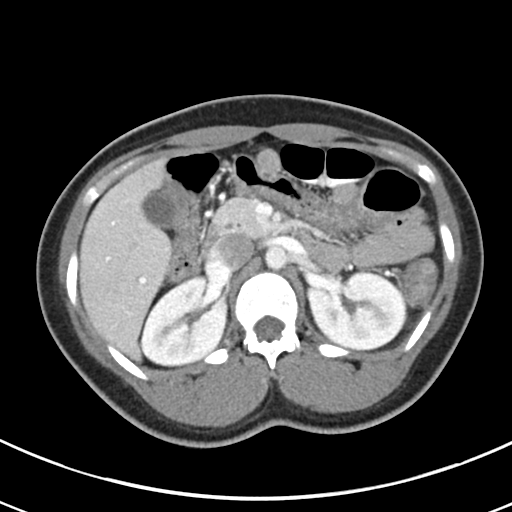
[im 76/95  soft-tissue]
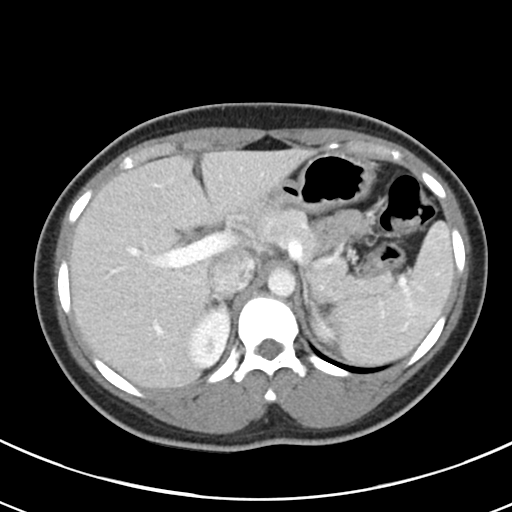
[im 83/95  soft-tissue]
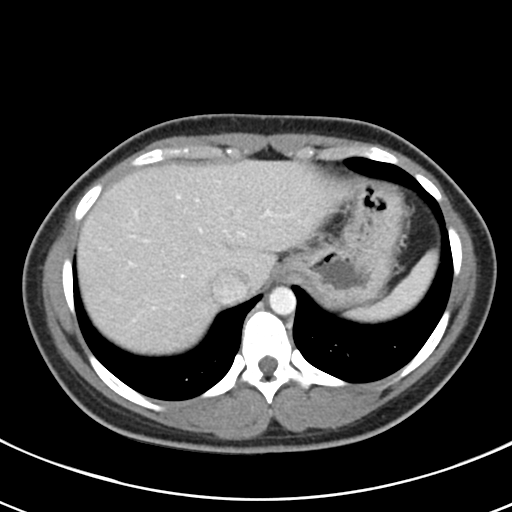
[im 91/95  soft-tissue]
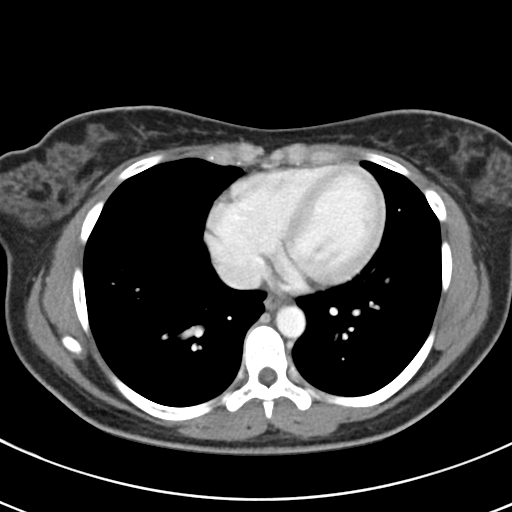

[Series 5: coronals · coronal · 0.70mm/px · 3 of 111 slices shown]
[im 37/111  soft-tissue]
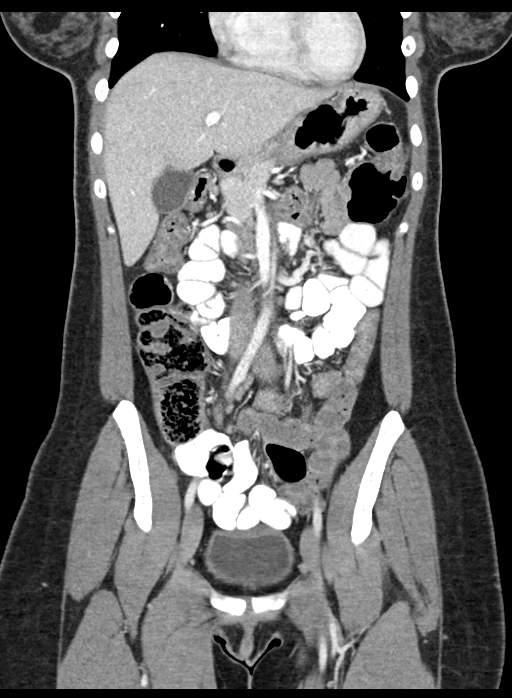
[im 49/111  soft-tissue]
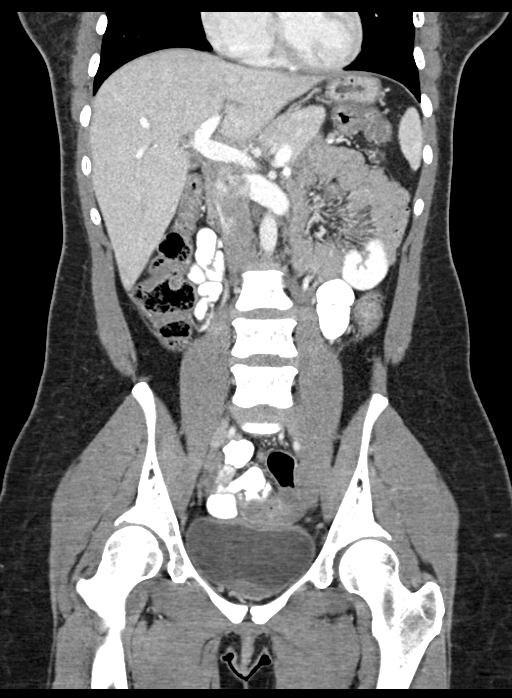
[im 62/111  soft-tissue]
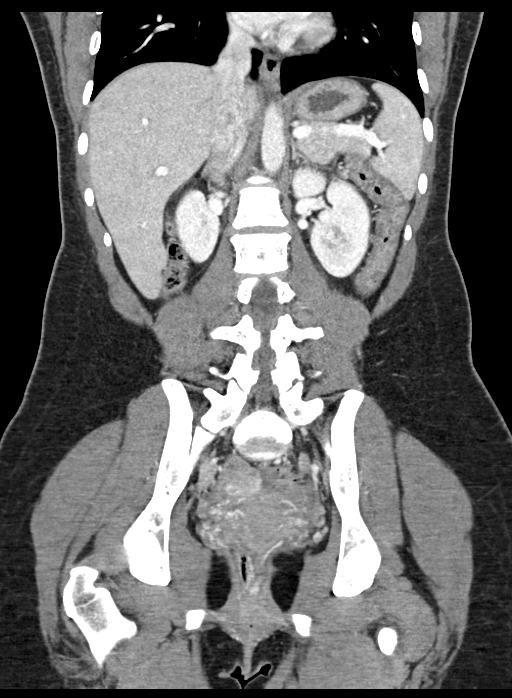

[16 of 46 positions shown; findings below may reference images not displayed]

FINDINGS: There is mild mural thickening and edema of the colon beginning in
the proximal transverse colon and extending continuously to the
rectum. There is relative sparing of the cecum and ascending colon.
The appearances are typical of infectious or inflammatory colitis.
There is no bowel obstruction. There is no extraluminal air. There
is no abscess or drainable fluid collection. Stomach and small bowel
appear normal. The mural thickening and edema do not conform to a
vascular territory to suggest ischemia.

There are normal appearances of the liver, spleen, pancreas,
adrenals and kidneys. No significant abnormality is evident in the
lower chest. No significant musculoskeletal abnormality is evident.
IMPRESSION: Mild changes of colitis, likely infectious or inflammatory. No
obstruction, perforation or abscess.

## 2021-01-31 ENCOUNTER — Encounter: Payer: Self-pay | Admitting: Emergency Medicine

## 2021-01-31 ENCOUNTER — Other Ambulatory Visit: Payer: Self-pay

## 2021-01-31 ENCOUNTER — Ambulatory Visit
Admission: EM | Admit: 2021-01-31 | Discharge: 2021-01-31 | Disposition: A | Discharge status: Home / Self Care | Payer: Self-pay | Attending: Emergency Medicine | Admitting: Emergency Medicine

## 2021-01-31 DIAGNOSIS — U071 COVID-19: Secondary | ICD-10-CM | POA: Insufficient documentation

## 2021-01-31 HISTORY — DX: Unspecified osteoarthritis, unspecified site: M19.90

## 2021-01-31 HISTORY — DX: Irritable bowel syndrome, unspecified: K58.9

## 2021-01-31 LAB — INFLUENZA A AND B ANTIGEN (CONVERTED LAB)
INFLUENZA A ANTIGEN, POC: NEGATIVE
INFLUENZA B ANTIGEN, POC: NEGATIVE

## 2021-01-31 LAB — POC SARS CORONAVIRUS 2 AG: SARSCOV2ONAVIRUS 2 AG: POSITIVE — AB

## 2021-01-31 MED ORDER — IPRATROPIUM BROMIDE 0.06 % NA SOLN: 2.0000 | Freq: Four times a day (QID) | NASAL | 12 refills | Status: AC

## 2021-01-31 MED ORDER — MOLNUPIRAVIR EUA 200MG CAPSULE: 4.0000 | ORAL_CAPSULE | Freq: Two times a day (BID) | ORAL | 0 refills | Status: AC

## 2021-01-31 MED ORDER — ACETAMINOPHEN 500 MG PO TABS
1000.0000 mg | ORAL_TABLET | Freq: Once | ORAL | Status: AC
  Administered 2021-01-31: 1000 mg via ORAL

## 2021-01-31 NOTE — Discharge Instructions (Addendum)
You will have to quarantine for 5 days from the start of your symptoms.  After 5 days you can break quarantine if your symptoms have improved and you have not had a fever for 24 hours without taking Tylenol or ibuprofen.  Use over-the-counter Tylenol and ibuprofen as needed for body aches and fever.  Use the Atrovent nasal spray, 2 squirts up each nostril every 6 hours, as needed for runny nose and nasal congestion.  Take the molnupiravir twice daily for 5 days for treatment of COVID-19.  If you develop any increased shortness of breath-especially at rest, you are unable to speak in full sentences, or is a late sign your lips are turning blue you need to go the ER for evaluation.

## 2021-01-31 NOTE — ED Provider Notes (Signed)
[facility]    CSN: [id number] Arrival date & time: 01/31/21  1702      History   Chief Complaint Chief Complaint  Patient presents with   Fever    HPI Judy Paul is a 25 y.o. female.   HPI  25 year old female here for evaluation of cold symptoms.  Patient reports that she was recently on a trip to [place] for a conference on women's health where someone tested positive for COVID.  She is reporting that today she developed chills, fever with a T-max of 102.4, runny nose, headache, and body aches.  She is also experiencing sore throat and ear pain.  She denies cough, shortness breath or wheezing, or GI complaints.  Past Medical History:  Diagnosis Date   Allergy    Anemia    Arthritis    per pt   Asthma    IBS (irritable bowel syndrome)     There are no problems to display for this patient.   Past Surgical History:  Procedure Laterality Date   TONSILLECTOMY Bilateral    WISDOM TOOTH EXTRACTION Bilateral     OB History   No obstetric history on file.      Home Medications    Prior to Admission medications   Medication Sig Start Date End Date Taking? Authorizing Provider  ipratropium (ATROVENT) 0.06 % nasal spray Place 2 sprays into both nostrils 4 (four) times daily. 01/31/21  Yes Becky Augusta, NP  molnupiravir EUA (LAGEVRIO) 200 mg CAPS capsule Take 4 capsules (800 mg total) by mouth 2 (two) times daily for 5 days. 01/31/21 02/05/21 Yes Becky Augusta, NP  norgestimate-ethinyl estradiol (ORTHO-CYCLEN,SPRINTEC,PREVIFEM) 0.25-35 MG-MCG tablet Take 1 tablet by mouth daily. 04/02/14   Brock Bad, MD    Family History Family History  Problem Relation Age of Onset   Fibroids Mother    Eczema Father    Infertility Maternal Aunt    Infertility Maternal Uncle    Cancer Maternal Grandmother    Hypertension Maternal Grandmother    Bipolar disorder Maternal Grandmother    Cancer Paternal Grandmother    Hypertension Paternal Grandmother      Social History Social History   Tobacco Use   Smoking status: Never   Smokeless tobacco: Never  Vaping Use   Vaping Use: Never used  Substance Use Topics   Alcohol use: No    Alcohol/week: 0.0 standard drinks   Drug use: No     Allergies   Latex and Mucinex [guaifenesin er]   Review of Systems Review of Systems  Constitutional:  Positive for fever. Negative for activity change and appetite change.  HENT:  Positive for congestion, ear pain, rhinorrhea and sore throat.   Respiratory:  Negative for cough, shortness of breath and wheezing.   Musculoskeletal:  Positive for arthralgias and myalgias.  Skin:  Negative for rash.  Neurological:  Positive for headaches.  Hematological: Negative.   Psychiatric/Behavioral: Negative.      Physical Exam Triage Vital Signs ED Triage Vitals  Enc Vitals Group     BP 01/31/21 1749 (!) 90/50     Pulse Rate 01/31/21 1749 (!) 119     Resp 01/31/21 1749 20     Temp 01/31/21 1749 (!) 102.8 F (39.3 C)     Temp Source 01/31/21 1749 Oral     SpO2 01/31/21 1749 97 %     Weight 01/31/21 1744 145 lb 8.1 oz (66 kg)     Height 01/31/21 1744 5\' 3"  (1.6 m)  Head Circumference --      Peak Flow --      Pain Score 01/31/21 1743 8     Pain Loc --      Pain Edu? --      Excl. in GC? --    No data found.  Updated Vital Signs BP (!) 90/50 (BP Location: Left Arm)   Pulse (!) 119   Temp (!) 102.8 F (39.3 C) (Oral)   Resp 20   Ht 5\' 3"  (1.6 m)   Wt 145 lb 8.1 oz (66 kg)   LMP 01/31/2021   SpO2 97%   BMI 25.77 kg/m   Visual Acuity Right Eye Distance:   Left Eye Distance:   Bilateral Distance:    Right Eye Near:   Left Eye Near:    Bilateral Near:     Physical Exam Vitals and nursing note reviewed.  Constitutional:      General: She is not in acute distress.    Appearance: Normal appearance. She is normal weight. She is ill-appearing.  HENT:     Head: Normocephalic and atraumatic.     Right Ear: Tympanic membrane, ear  canal and external ear normal. There is no impacted cerumen.     Left Ear: Tympanic membrane, ear canal and external ear normal. There is no impacted cerumen.     Nose: Congestion and rhinorrhea present.     Mouth/Throat:     Mouth: Mucous membranes are moist.     Pharynx: Oropharynx is clear. No posterior oropharyngeal erythema.  Cardiovascular:     Rate and Rhythm: Normal rate and regular rhythm.     Pulses: Normal pulses.     Heart sounds: Normal heart sounds. No murmur heard.   No gallop.  Pulmonary:     Effort: Pulmonary effort is normal.     Breath sounds: Normal breath sounds. No wheezing, rhonchi or rales.  Musculoskeletal:     Cervical back: Normal range of motion and neck supple.  Lymphadenopathy:     Cervical: No cervical adenopathy.  Skin:    General: Skin is warm and dry.     Findings: No erythema or rash.  Neurological:     General: No focal deficit present.     Mental Status: She is alert and oriented to person, place, and time.  Psychiatric:        Mood and Affect: Mood normal.        Behavior: Behavior normal.        Thought Content: Thought content normal.        Judgment: Judgment normal.     UC Treatments / Results  Labs (all labs ordered are listed, but only abnormal results are displayed) Labs Reviewed - No data to display  EKG   Radiology No results found.  Procedures Procedures (including critical care time)  Medications Ordered in UC Medications  acetaminophen (TYLENOL) tablet 1,000 mg (1,000 mg Oral Given 01/31/21 1805)    Initial Impression / Assessment and Plan / UC Course  I have reviewed the triage vital signs and the nursing notes.  Pertinent labs & imaging results that were available during my care of the patient were reviewed by me and considered in my medical decision making (see chart for details).  Patient is a pleasant though ill-appearing 25 year old female here for evaluation of fever, runny nose, nasal congestion,  headache, body aches, sore throat, and ear pain that all started today.  She denies any lower respiratory or GI symptoms.  She  was recently on a trip to [place] where she was at a women's health conference and someone tested positive for COVID.  Patient's physical exam reveals pearly gray tympanic membranes bilaterally with normal light reflex and clear external auditory canals.  Nasal mucosa is mildly erythematous and edematous with scant clear nasal discharge.  Oropharyngeal exam is benign.  Patient has no cervical lymphadenopathy appreciable on exam.  Cardiopulmonary dam reveals clear lung sounds in all fields.  Will perform rapid flu and COVID test.  Rapid influenza is negative.  Rapid COVID is positive.  Will discharge patient home with a diagnosis of COVID-19 and will give Atrovent nasal spray to help with her nasal congestion and runny nose.  Patient is not having a cough at present.  Due to patient's history of asthma will prescribe molnupiravir as we do not have any recent blood work in epic and the lab has closed for the evening.   Final Clinical Impressions(s) / UC Diagnoses   Final diagnoses:  COVID-19     Discharge Instructions      You will have to quarantine for 5 days from the start of your symptoms.  After 5 days you can break quarantine if your symptoms have improved and you have not had a fever for 24 hours without taking Tylenol or ibuprofen.  Use over-the-counter Tylenol and ibuprofen as needed for body aches and fever.  Use the Atrovent nasal spray, 2 squirts up each nostril every 6 hours, as needed for runny nose and nasal congestion.  Take the molnupiravir twice daily for 5 days for treatment of COVID-19.  If you develop any increased shortness of breath-especially at rest, you are unable to speak in full sentences, or is a late sign your lips are turning blue you need to go the ER for evaluation.      ED Prescriptions     Medication Sig Dispense Auth. Provider    ipratropium (ATROVENT) 0.06 % nasal spray Place 2 sprays into both nostrils 4 (four) times daily. 15 mL Becky Augusta, NP   molnupiravir EUA (LAGEVRIO) 200 mg CAPS capsule Take 4 capsules (800 mg total) by mouth 2 (two) times daily for 5 days. 40 capsule Becky Augusta, NP      PDMP not reviewed this encounter.   Becky Augusta, NP 01/31/21 1824

## 2021-01-31 NOTE — ED Triage Notes (Signed)
Pt c/o chills, fever (102.4), headache, runny nose, and body aches. Started today. She has had recent travel to [place]. She has not taken anything for a fever or body aches.
